# Patient Record
Sex: Female | Born: 1965 | Race: White | Hispanic: No | Marital: Married | Smoking: Never smoker
Health system: Southern US, Community
[De-identification: ages and names within clinical notes are randomized; demographics above are authoritative.]

## PROBLEM LIST (undated history)

## (undated) DIAGNOSIS — E119 Type 2 diabetes mellitus without complications: Secondary | ICD-10-CM

## (undated) DIAGNOSIS — E079 Disorder of thyroid, unspecified: Secondary | ICD-10-CM

## (undated) HISTORY — PX: ABDOMINAL HYSTERECTOMY: SHX81

## (undated) HISTORY — PX: CHOLECYSTECTOMY: SHX55

---

## 2011-02-27 ENCOUNTER — Other Ambulatory Visit: Payer: Self-pay | Admitting: Orthopedic Surgery

## 2011-02-27 ENCOUNTER — Ambulatory Visit
Admission: RE | Admit: 2011-02-27 | Discharge: 2011-02-27 | Disposition: A | Discharge status: Home / Self Care | Payer: BC Managed Care – PPO | Source: Ambulatory Visit | Attending: Orthopedic Surgery | Admitting: Orthopedic Surgery

## 2011-02-27 DIAGNOSIS — R52 Pain, unspecified: Secondary | ICD-10-CM

## 2011-02-27 DIAGNOSIS — R609 Edema, unspecified: Secondary | ICD-10-CM

## 2019-12-06 ENCOUNTER — Encounter: Payer: Self-pay | Admitting: Gastroenterology

## 2019-12-07 ENCOUNTER — Encounter (HOSPITAL_BASED_OUTPATIENT_CLINIC_OR_DEPARTMENT_OTHER): Payer: Self-pay

## 2019-12-07 ENCOUNTER — Emergency Department (HOSPITAL_BASED_OUTPATIENT_CLINIC_OR_DEPARTMENT_OTHER): Payer: BC Managed Care – PPO

## 2019-12-07 ENCOUNTER — Other Ambulatory Visit: Payer: Self-pay

## 2019-12-07 ENCOUNTER — Emergency Department (HOSPITAL_BASED_OUTPATIENT_CLINIC_OR_DEPARTMENT_OTHER)
Admission: EM | Admit: 2019-12-07 | Discharge: 2019-12-07 | Disposition: A | Discharge status: Home / Self Care | Payer: BC Managed Care – PPO | Attending: Emergency Medicine | Admitting: Emergency Medicine

## 2019-12-07 DIAGNOSIS — I10 Essential (primary) hypertension: Secondary | ICD-10-CM | POA: Diagnosis not present

## 2019-12-07 DIAGNOSIS — Z9049 Acquired absence of other specified parts of digestive tract: Secondary | ICD-10-CM | POA: Insufficient documentation

## 2019-12-07 DIAGNOSIS — Z79899 Other long term (current) drug therapy: Secondary | ICD-10-CM | POA: Diagnosis not present

## 2019-12-07 DIAGNOSIS — R778 Other specified abnormalities of plasma proteins: Secondary | ICD-10-CM

## 2019-12-07 DIAGNOSIS — R112 Nausea with vomiting, unspecified: Secondary | ICD-10-CM | POA: Diagnosis present

## 2019-12-07 DIAGNOSIS — R748 Abnormal levels of other serum enzymes: Secondary | ICD-10-CM | POA: Insufficient documentation

## 2019-12-07 DIAGNOSIS — K529 Noninfective gastroenteritis and colitis, unspecified: Secondary | ICD-10-CM | POA: Insufficient documentation

## 2019-12-07 LAB — URINALYSIS, ROUTINE W REFLEX MICROSCOPIC
Bilirubin Urine: NEGATIVE
Glucose, UA: NEGATIVE mg/dL
Hgb urine dipstick: NEGATIVE
Ketones, ur: NEGATIVE mg/dL
Leukocytes,Ua: NEGATIVE
Nitrite: NEGATIVE
Protein, ur: NEGATIVE mg/dL
Specific Gravity, Urine: 1.02 (ref 1.005–1.030)
pH: 7 (ref 5.0–8.0)

## 2019-12-07 LAB — COMPREHENSIVE METABOLIC PANEL
ALT: 24 U/L (ref 0–44)
AST: 18 U/L (ref 15–41)
Albumin: 4.1 g/dL (ref 3.5–5.0)
Alkaline Phosphatase: 54 U/L (ref 38–126)
Anion gap: 11 (ref 5–15)
BUN: 19 mg/dL (ref 6–20)
CO2: 25 mmol/L (ref 22–32)
Calcium: 9.5 mg/dL (ref 8.9–10.3)
Chloride: 100 mmol/L (ref 98–111)
Creatinine, Ser: 1.19 mg/dL — ABNORMAL HIGH (ref 0.44–1.00)
GFR calc Af Amer: 60 mL/min — ABNORMAL LOW (ref >60)
GFR calc non Af Amer: 51 mL/min — ABNORMAL LOW (ref >60)
Glucose, Bld: 102 mg/dL — ABNORMAL HIGH (ref 70–99)
Potassium: 4.3 mmol/L (ref 3.5–5.1)
Sodium: 136 mmol/L (ref 135–145)
Total Bilirubin: 0.3 mg/dL (ref 0.3–1.2)
Total Protein: 7.8 g/dL (ref 6.5–8.1)

## 2019-12-07 LAB — CBC
HCT: 44.4 % (ref 36.0–46.0)
Hemoglobin: 13.8 g/dL (ref 12.0–15.0)
MCH: 27.8 pg (ref 26.0–34.0)
MCHC: 31.1 g/dL (ref 30.0–36.0)
MCV: 89.3 fL (ref 80.0–100.0)
Platelets: 396 10*3/uL (ref 150–400)
RBC: 4.97 MIL/uL (ref 3.87–5.11)
RDW: 13.6 % (ref 11.5–15.5)
WBC: 11.8 10*3/uL — ABNORMAL HIGH (ref 4.0–10.5)
nRBC: 0 % (ref 0.0–0.2)

## 2019-12-07 LAB — TROPONIN I (HIGH SENSITIVITY)
Troponin I (High Sensitivity): 53 ng/L — ABNORMAL HIGH (ref <18)
Troponin I (High Sensitivity): 41 ng/L — ABNORMAL HIGH (ref <18)

## 2019-12-07 LAB — LIPASE, BLOOD: Lipase: 26 U/L (ref 11–51)

## 2019-12-07 MED ORDER — METRONIDAZOLE 500 MG PO TABS: 500.0000 mg | ORAL_TABLET | Freq: Two times a day (BID) | ORAL | 0 refills | Status: DC

## 2019-12-07 MED ORDER — CIPROFLOXACIN HCL 500 MG PO TABS: 500.0000 mg | ORAL_TABLET | Freq: Two times a day (BID) | ORAL | 0 refills | Status: AC

## 2019-12-07 MED ORDER — SODIUM CHLORIDE 0.9% FLUSH
3.0000 mL | Freq: Once | INTRAVENOUS | Status: DC
  Filled 2019-12-07: qty 3

## 2019-12-07 MED ORDER — ONDANSETRON 4 MG PO TBDP: 4.0000 mg | ORAL_TABLET | Freq: Three times a day (TID) | ORAL | 0 refills | Status: DC | PRN

## 2019-12-07 MED ORDER — IOHEXOL 300 MG/ML  SOLN
100.0000 mL | Freq: Once | INTRAMUSCULAR | Status: AC | PRN
  Administered 2019-12-07: 100 mL via INTRAVENOUS

## 2019-12-07 MED ORDER — ONDANSETRON HCL 4 MG/2ML IJ SOLN
4.0000 mg | Freq: Once | INTRAMUSCULAR | Status: AC
  Administered 2019-12-07: 4 mg via INTRAVENOUS
  Filled 2019-12-07: qty 2

## 2019-12-07 MED FILL — CIPROFLOXACIN HCL 500 MG TA: 500 | 7 days supply | Qty: 14 | Fill #0

## 2019-12-07 MED FILL — METRONIDAZOLE 500 MG TABS: 500 | 7 days supply | Qty: 14 | Fill #0

## 2019-12-07 MED FILL — ONDANSETRON ODT 4 MG TABLET: 4 | 2 days supply | Qty: 6 | Fill #0

## 2019-12-07 NOTE — ED Triage Notes (Signed)
Pt c/o n/v x 2 week-diarrhea with near syncopal episode today x today-NAD-steady gait

## 2019-12-07 NOTE — ED Notes (Signed)
Ginger ale and crackers given. Provider at bedside.

## 2019-12-07 NOTE — ED Provider Notes (Signed)
[facility] EMERGENCY DEPARTMENT Provider Note   CSN: [id number] Arrival date & time: 12/07/19  1138     History Chief Complaint  Patient presents with   Emesis    Alicia Scott is a 54 y.o. female past medical Struve hypertension, cholecystectomy who presents for evaluation of nausea/vomiting x2 weeks.  She reports that her last 2 to 3 weeks, after she has eaten something, she will have vomiting.  She states it occurs about 15 minutes after she eats.  She states no blood noted in emesis.  No bile noted.  States this occurs every time after she eats and has not been able to tolerate much p.o.  She states she will get a feeling like heartburn in her epigastric region and cramping in her stomach.  She has not had any fevers with this.  She reports that yesterday she felt slightly lightheaded.  Today, she ate breakfast and states that she was able to keep it down.  She reports that while she was getting ready for work, she started feeling gassy in her abdomen and started having sharp shooting pains in her abdomen.  Reports that she went to the bathroom and had an episode of diarrhea.  No blood noted in stool.  Patient reports that she got very clammy, diaphoretic and had to lay down against the floor.  She went to urgent care who did an EKG and chest x-ray but told her she needed to be evaluated for her heart.  Patient reports that the episode today was the first time that that happened.  She states she did not have any chest pain but was concerned that other symptoms were masking it.  She reports no chest pain right now.  She states she has had some slight lower abdominal pain, right greater than left.  She denies any difficulty breathing, dysuria, hematuria.  No recent travel.  No known Covid exposure.  She has a history of hypertension but no history of diabetes.  No personal cardiac history.  No family history of MI before the age of 32.  She does not smoke.  The history is provided  by the patient.    HPI: A 53 year old patient with a history of hypertension presents for evaluation of chest pain. Initial onset of pain was approximately 1-3 hours ago. The patient's chest pain is not worse with exertion. The patient complains of nausea and reports some diaphoresis. The patient's chest pain is not middle- or left-sided, is not well-localized, is not described as heaviness/pressure/tightness, is not sharp and does not radiate to the arms/jaw/neck. The patient has no history of stroke, has no history of peripheral artery disease, has not smoked in the past 90 days, denies any history of treated diabetes, has no relevant family history of coronary artery disease (first degree relative at less than age 30), has no history of hypercholesterolemia and does not have an elevated BMI (>=30).   History reviewed. No pertinent past medical history.  There are no problems to display for this patient.   Past Surgical History:  Procedure Laterality Date   ABDOMINAL HYSTERECTOMY     CHOLECYSTECTOMY       OB History   No obstetric history on file.     No family history on file.  Social History   Tobacco Use   Smoking status: Never Smoker   Smokeless tobacco: Never Used  Substance Use Topics   Alcohol use: Yes    Comment: occ   Drug use: Never  Home Medications Prior to Admission medications   Medication Sig Start Date End Date Taking? Authorizing Provider  buPROPion (WELLBUTRIN) 75 MG tablet Take 75 mg by mouth daily.   Yes [provider]  levothyroxine (SYNTHROID) 125 MCG tablet Take 125 mcg by mouth daily before breakfast.   Yes [provider]  lisinopril (ZESTRIL) 10 MG tablet Take 10 mg by mouth daily.   Yes [provider]  venlafaxine XR (EFFEXOR-XR) 150 MG 24 hr capsule Take 150 mg by mouth daily with breakfast.   Yes [provider]  zolpidem (AMBIEN CR) 12.5 MG CR tablet Take 12.5 mg by mouth at bedtime as needed for  sleep.   Yes [provider]  ciprofloxacin (CIPRO) 500 MG tablet Take 1 tablet (500 mg total) by mouth every 12 (twelve) hours for 7 days. 12/07/19 12/14/19  Maxwell CaulLayden, Kischa Altice A, PA-C  metroNIDAZOLE (FLAGYL) 500 MG tablet Take 1 tablet (500 mg total) by mouth 2 (two) times daily. 12/07/19   Maxwell CaulLayden, Taylan Mayhan A, PA-C  ondansetron (ZOFRAN ODT) 4 MG disintegrating tablet Take 1 tablet (4 mg total) by mouth every 8 (eight) hours as needed for nausea or vomiting. 12/07/19   Maxwell CaulLayden, Rhona Fusilier A, PA-C    Allergies    Penicillins and Sulfa antibiotics  Review of Systems   Review of Systems  Constitutional: Negative for fever.  Respiratory: Negative for cough and shortness of breath.   Cardiovascular: Negative for chest pain.  Gastrointestinal: Positive for abdominal pain, diarrhea, nausea and vomiting.  Genitourinary: Negative for dysuria and hematuria.  Neurological: Negative for headaches.  All other systems reviewed and are negative.   Physical Exam Updated Vital Signs BP 117/81    Pulse 94    Temp 98.2 F (36.8 C) (Oral)    Resp 14    Ht 5\' 9"  (1.753 m)    Wt 115.7 kg    SpO2 97%    BMI 37.66 kg/m   Physical Exam Vitals and nursing note reviewed.  Constitutional:      Appearance: Normal appearance. She is well-developed.  HENT:     Head: Normocephalic and atraumatic.  Eyes:     General: Lids are normal.     Conjunctiva/sclera: Conjunctivae normal.     Pupils: Pupils are equal, round, and reactive to light.  Cardiovascular:     Rate and Rhythm: Normal rate and regular rhythm.     Pulses: Normal pulses.     Heart sounds: Normal heart sounds. No murmur. No friction rub. No gallop.   Pulmonary:     Effort: Pulmonary effort is normal.     Breath sounds: Normal breath sounds.     Comments: Lungs clear to auscultation bilaterally.  Symmetric chest rise.  No wheezing, rales, rhonchi. Abdominal:     Palpations: Abdomen is soft. Abdomen is not rigid.     Tenderness: There is abdominal  tenderness in the right lower quadrant. There is no right CVA tenderness, left CVA tenderness or guarding.     Comments: Abdomen soft, nondistended.  Tenderness palpation in the right lower quadrant.  No rigidity, guarding.  No CVA tenderness noted bilaterally.  Musculoskeletal:        General: Normal range of motion.     Cervical back: Full passive range of motion without pain.  Skin:    General: Skin is warm and dry.     Capillary Refill: Capillary refill takes less than 2 seconds.  Neurological:     Mental Status: She is alert and oriented  to person, place, and time.  Psychiatric:        Speech: Speech normal.     ED Results / Procedures / Treatments   Labs (all labs ordered are listed, but only abnormal results are displayed) Labs Reviewed  COMPREHENSIVE METABOLIC PANEL - Abnormal; Notable for the following components:      Result Value   Glucose, Bld 102 (*)    Creatinine, Ser 1.19 (*)    GFR calc non Af Amer 51 (*)    GFR calc Af Amer 60 (*)    All other components within normal limits  CBC - Abnormal; Notable for the following components:   WBC 11.8 (*)    All other components within normal limits  TROPONIN I (HIGH SENSITIVITY) - Abnormal; Notable for the following components:   Troponin I (High Sensitivity) 53 (*)    All other components within normal limits  TROPONIN I (HIGH SENSITIVITY) - Abnormal; Notable for the following components:   Troponin I (High Sensitivity) 41 (*)    All other components within normal limits  LIPASE, BLOOD  URINALYSIS, ROUTINE W REFLEX MICROSCOPIC    EKG EKG Interpretation  Date/Time:  Wednesday December 07 2019 13:02:56 EDT Ventricular Rate:  85 PR Interval:    QRS Duration: 106 QT Interval:  395 QTC Calculation: 470 R Axis:   23 Text Interpretation: Sinus rhythm EKG WITHIN NORMAL LIMITS Confirmed by Theotis Burrow [phone]) on 12/07/2019 2:47:26 PM   Radiology CT ABDOMEN PELVIS W CONTRAST  Result Date: 12/07/2019 CLINICAL DATA:   54 year old female with nausea vomiting and diarrhea for 2 weeks. Lower abdominal pain, greater on the right. EXAM: CT ABDOMEN AND PELVIS WITH CONTRAST TECHNIQUE: Multidetector CT imaging of the abdomen and pelvis was performed using the standard protocol following bolus administration of intravenous contrast. CONTRAST:  177mL OMNIPAQUE IOHEXOL 300 MG/ML  SOLN COMPARISON:  None. FINDINGS: Lower chest: Negative. Small calcified granuloma in the left lower lobe. Hepatobiliary: Surgically absent gallbladder. Hepatic steatosis. No bile duct enlargement. Pancreas: Negative. Spleen: Negative. Adrenals/Urinary Tract: Normal adrenal glands. Symmetric renal enhancement and contrast excretion with decompressed and normal proximal ureters. No nephrolithiasis. Diminutive and unremarkable urinary bladder. Stomach/Bowel: Decompressed but thick-walled and redundant sigmoid colon throughout the pelvis. Similar decompressed but inflamed appearance of the rectum, descending colon. The transverse and right colon are intermittently more normal, but with some decompressed segments which also appear mildly inflamed. The cecum is on a lax mesentery in the mid abdomen containing stool, with a suspected diminutive and normal appendix on series 2, image 62. Decompressed and negative terminal ileum. Oral contrast has not yet reached the distal small bowel. No dilated or inflamed small bowel loops identified. Negative stomach and duodenum. No free air or free fluid. Vascular/Lymphatic: Mild atherosclerosis. The major arterial structures are patent. Portal venous system appears patent. No lymphadenopathy. Reproductive: Surgically absent uterus. Normal left ovary on series 2, image 68. The right ovary is diminutive or absent. Other: No pelvic free fluid. Musculoskeletal: Lower lumbar facet degeneration. Degenerative changes to both SI joints. No acute osseous abnormality identified. IMPRESSION: 1. Much of the large bowel is decompressed but  appears inflamed compatible with Widespread Infectious Or Inflammatory Colitis. The right colon is relatively spared, with no evidence of acute appendicitis. 2. No abnormal small bowel. No free fluid. 3. Hepatic steatosis. Electronically Signed   By: Genevie Ann M.D.   On: 12/07/2019 15:58    Procedures Procedures (including critical care time)  Medications Ordered in ED Medications  sodium chloride  flush (NS) 0.9 % injection 3 mL (3 mLs Intravenous Not Given 12/07/19 1255)  iohexol (OMNIPAQUE) 300 MG/ML solution 100 mL (100 mLs Intravenous Contrast Given 12/07/19 1517)  ondansetron (ZOFRAN) injection 4 mg (4 mg Intravenous Given 12/07/19 1601)    ED Course  I have reviewed the triage vital signs and the nursing notes.  Pertinent labs & imaging results that were available during my care of the patient were reviewed by me and considered in my medical decision making (see chart for details).    MDM Rules/Calculators/A&P HEAR Score: 43                    54 year old female who presents for evaluation of nausea/vomiting x2 weeks and an episode of near syncope earlier today.  She reports that she has not been able to tolerate much p.o. over the last 2 to 3 weeks.  Today she had an episode where she had a sharp pain in her abdomen and had an episode of diarrhea.  She reports that she got diaphoretic, had episode of vomiting and had to lay down on the floor.  She went to urgent care for evaluation of symptoms and they told her to come to the emergency department for further evaluation.  On ED arrival, she states she has no chest pain.  She does report some lower abdominal pain.  On initial ED arrival, she is afebrile, nontoxic-appearing.  Vital signs are stable.  Sitter infectious etiology such as viral GI versus foodborne illness.  Additionally, given that she has some right lower quadrant tenderness, question if this is appendicitis.  Plan for labs, imaging.  Additionally, low suspicion for ACS as her story  sounds atypical.  Patient states that she does not think she had chest pain but just "wondered if she was having a heart attack." She states that urgent care sent her over for evaluation of her heart.  Only cardiac risk factor is hypertension.  Plan to check labs, imaging.  Patient had a normal chest x-ray at urgent care.  UA negative for any infectious etiology.  Lipase is unremarkable.  CMP shows BUN of 19, creatinine of 1.19.  CBC shows slight leukocytosis at 11.8.  Hemoglobin is unremarkable.  Initial troponin is 53.  In her history/risk factors, she has a heart score of 3.  We will plan for delta troponin.  CT on pelvis shows large bowel is decompressed and inflamed compatible with widespread infectious or inflammatory colitis.  Right colon is relatively spared with no evidence of acute appendicitis.  Her delta troponin is 41.   I discussed Dr. Allyson Sabal (cardiology).  At this time, no indication for acute cardiac intervention.  Agrees with plan for discharge home.  Discussed results with patient.  Patient reports feeling better.  She is hemodynamically stable.  She has been able tolerate p.o. here in ED.  Repeat abdominal exam shows improved tenderness.  Given that her symptoms have been ongoing for about 2 weeks, will plan to treat as possible infectious etiology of colitis.  Patient with allergy to penicillin and sulfa.  She has tolerated Cipro in the past before.  Patient already has an appointment with GI set up for May 7.  I encouraged her to keep that appointment. At this time, patient exhibits no emergent life-threatening condition that require further evaluation in ED or admission. Patient had ample opportunity for questions and discussion. All patient's questions were answered with full understanding. Strict return precautions discussed. Patient expresses understanding and agreement  to plan.   Portions of this note were generated with Scientist, clinical (histocompatibility and immunogenetics). Dictation errors may occur  despite best attempts at proofreading.   Final Clinical Impression(s) / ED Diagnoses Final diagnoses:  Colitis  Elevated troponin    Rx / DC Orders ED Discharge Orders         Ordered    ciprofloxacin (CIPRO) 500 MG tablet  Every 12 hours     12/07/19 1652    metroNIDAZOLE (FLAGYL) 500 MG tablet  2 times daily     12/07/19 1652    ondansetron (ZOFRAN ODT) 4 MG disintegrating tablet  Every 8 hours PRN     12/07/19 1652           Maxwell Caul, PA-C 12/07/19 1723    Little, Ambrose Finland, MD 12/08/19 [phone]

## 2019-12-07 NOTE — Discharge Instructions (Signed)
As we discussed, your CT scan did show evidence of colitis.  Take antibiotics as directed. Please take all of your antibiotics until finished.  Follow-up with your primary care doctor.  Your EKG was reassuring.  As we discussed, your troponin was slightly elevated today though your work-up otherwise is reassuring.  Your troponin decreased while being here in the ED.  Follow-up with your primary care doctor.  Return emergency department for any worsening abdominal pain, inability to eat or drink anything, fever or any other worsening concerning symptoms.

## 2019-12-07 NOTE — ED Notes (Signed)
Pt on monitor 

## 2019-12-30 ENCOUNTER — Other Ambulatory Visit: Payer: Self-pay

## 2019-12-30 ENCOUNTER — Ambulatory Visit: Payer: BC Managed Care – PPO | Admitting: Gastroenterology

## 2019-12-30 ENCOUNTER — Encounter: Payer: Self-pay | Admitting: Gastroenterology

## 2019-12-30 VITALS — BP 120/88 | HR 85 | Temp 97.1°F | Ht 69.0 in | Wt 254.0 lb

## 2019-12-30 DIAGNOSIS — K219 Gastro-esophageal reflux disease without esophagitis: Secondary | ICD-10-CM | POA: Diagnosis not present

## 2019-12-30 MED ORDER — OMEPRAZOLE 20 MG PO CPDR: 20.0000 mg | DELAYED_RELEASE_CAPSULE | Freq: Every morning | ORAL | 3 refills | Status: DC

## 2019-12-30 NOTE — Progress Notes (Signed)
Chief Complaint:   Referring Provider:  No ref. provider found      ASSESSMENT AND PLAN;   #1.  Longstanding  H/O GERD  #2. Recent visit d/t acute GEitis 12/07/2019. Neg CT AP otherwise. Neg screening colon 06/2017.  Plan: - Omeprazole 20mg  po qd #30, 11 refiils. - EGD for further evaluation.  Discussed risks and benefits.  She wishes to proceed. - Records for last colon from [facility].  Since colonoscopy was negative previously and patient is very reliable, I would hold off on repeat colonoscopy at the present time.  Of note that she is not having any red flag symptoms.  She agrees with the above approach. - Nonpharmacologic means of reflux control.   HPI:    Alicia Scott is a 54 y.o. female   [facility] ED visit 12/07/2019 with N/V/diarrhea, CT AP showed colitis, likely acute GEitis. Treated with Cipro/Flagyl/Zofran.  Her cardiac eval was neg. Comes to GI clinic for follow-up visit.  Has longstanding history of "severe acid reflux" x several yrs, more over last 2 months.  She also had it when she was pregnant over 30 years ago.  No odynophagia or dysphagia.  Took omeprazole for 14 days with good relief.  Has history of constipation.  Never had diarrhea before the above episode.  She could not identify any definite triggering factors.  No bowel problems currently.  Having BM1/day.  No abdominal pain.  No fever chills night sweats.  No recent weight loss.  No nonsteroidals.  Nobody in the family had any viral illness.  She was not tested for COVID-19.  Her CBC revealed mildly elevated WBC count.  She had normal CMP and lipase except for mild dehydration.  She had colonoscopy at age 43 which was unremarkable.  Told that she does not need repeat colonoscopy for 10 years.  From care everywhere I could see a procedure done on (likely colon) 07/21/2017 at [facility]- Report awaited-not in care everywhere.   History reviewed. No pertinent past medical history.  Past Surgical History:    Procedure Laterality Date  . ABDOMINAL HYSTERECTOMY    . CHOLECYSTECTOMY      Family History  Problem Relation Age of Onset  . Kidney Stones Mother   . Ulcerative colitis Daughter     Social History   Tobacco Use  . Smoking status: Never Smoker  . Smokeless tobacco: Never Used  Substance Use Topics  . Alcohol use: Yes    Comment: occ  . Drug use: Never    Current Outpatient Medications  Medication Sig Dispense Refill  . buPROPion (WELLBUTRIN) 75 MG tablet Take 75 mg by mouth daily.    [facility] levothyroxine (SYNTHROID) 125 MCG tablet Take 125 mcg by mouth daily before breakfast.    . lisinopril (ZESTRIL) 10 MG tablet Take 10 mg by mouth daily.    Marland Kitchen venlafaxine XR (EFFEXOR-XR) 150 MG 24 hr capsule Take 150 mg by mouth daily with breakfast.    . zolpidem (AMBIEN CR) 12.5 MG CR tablet Take 12.5 mg by mouth at bedtime as needed for sleep.     No current facility-administered medications for this visit.    Allergies  Allergen Reactions  . Penicillins   . Sulfa Antibiotics     Review of Systems:  Constitutional: Denies fever, chills, diaphoresis, appetite change and fatigue.  HEENT: Denies photophobia, eye pain, redness, hearing loss, ear pain, congestion, sore throat, rhinorrhea, sneezing, mouth sores, neck pain, neck stiffness and tinnitus.   Respiratory: Denies SOB,  DOE, cough, chest tightness,  and wheezing.   Cardiovascular: Denies chest pain, palpitations and leg swelling.  Genitourinary: Denies dysuria, urgency, frequency, hematuria, flank pain and difficulty urinating.  Musculoskeletal: Denies myalgias, back pain, joint swelling, arthralgias and gait problem.  Skin: No rash.  Neurological: Denies dizziness, seizures, syncope, weakness, light-headedness, numbness and headaches.  Hematological: Denies adenopathy. Easy bruising, personal or family bleeding history  Psychiatric/Behavioral: HAS anxiety or depression and insomnia     Physical Exam:    BP 120/88   Pulse  85   Temp (!) 97.1 F (36.2 C)   Ht 5\' 9"  (1.753 m)   Wt 254 lb (115.2 kg)   BMI 37.51 kg/m  Wt Readings from Last 3 Encounters:  12/30/19 254 lb (115.2 kg)  12/07/19 255 lb (115.7 kg)   Constitutional:  Well-developed, in no acute distress. Psychiatric: Normal mood and affect. Behavior is normal. HEENT: Pupils normal.  Conjunctivae are normal. No scleral icterus. Neck supple.  Cardiovascular: Normal rate, regular rhythm. No edema Pulmonary/chest: Effort normal and breath sounds normal. No wheezing, rales or rhonchi. Abdominal: Soft, nondistended.  Mild epigastric tenderness.  Bowel sounds active throughout. There are no masses palpable. No hepatomegaly. Rectal:  defered Neurological: Alert and oriented to person place and time. Skin: Skin is warm and dry. No rashes noted.  Data Reviewed: I have personally reviewed following labs and imaging studies  CBC: CBC Latest Ref Rng & Units 12/07/2019  WBC 4.0 - 10.5 K/uL 11.8(H)  Hemoglobin 12.0 - 15.0 g/dL 12/09/2019  Hematocrit 17.0 - 46.0 % 44.4  Platelets 150 - 400 K/uL 396    CMP: CMP Latest Ref Rng & Units 12/07/2019  Glucose 70 - 99 mg/dL 12/09/2019)  BUN 6 - 20 mg/dL 19  Creatinine 494(W - 9.67 mg/dL 5.91)  Sodium 6.38(G - 665 mmol/L 136  Potassium 3.5 - 5.1 mmol/L 4.3  Chloride 98 - 111 mmol/L 100  CO2 22 - 32 mmol/L 25  Calcium 8.9 - 10.3 mg/dL 9.5  Total Protein 6.5 - 8.1 g/dL 7.8  Total Bilirubin 0.3 - 1.2 mg/dL 0.3  Alkaline Phos 38 - 126 U/L 54  AST 15 - 41 U/L 18  ALT 0 - 44 U/L 24      Radiology Studies: CT ABDOMEN PELVIS W CONTRAST  Result Date: 12/07/2019 CLINICAL DATA:  54 year old female with nausea vomiting and diarrhea for 2 weeks. Lower abdominal pain, greater on the right. EXAM: CT ABDOMEN AND PELVIS WITH CONTRAST TECHNIQUE: Multidetector CT imaging of the abdomen and pelvis was performed using the standard protocol following bolus administration of intravenous contrast. CONTRAST:  53 OMNIPAQUE IOHEXOL 300  MG/ML  SOLN COMPARISON:  None. FINDINGS: Lower chest: Negative. Small calcified granuloma in the left lower lobe. Hepatobiliary: Surgically absent gallbladder. Hepatic steatosis. No bile duct enlargement. Pancreas: Negative. Spleen: Negative. Adrenals/Urinary Tract: Normal adrenal glands. Symmetric renal enhancement and contrast excretion with decompressed and normal proximal ureters. No nephrolithiasis. Diminutive and unremarkable urinary bladder. Stomach/Bowel: Decompressed but thick-walled and redundant sigmoid colon throughout the pelvis. Similar decompressed but inflamed appearance of the rectum, descending colon. The transverse and right colon are intermittently more normal, but with some decompressed segments which also appear mildly inflamed. The cecum is on a lax mesentery in the mid abdomen containing stool, with a suspected diminutive and normal appendix on series 2, image 62. Decompressed and negative terminal ileum. Oral contrast has not yet reached the distal small bowel. No dilated or inflamed small bowel loops identified. Negative stomach and duodenum. No  free air or free fluid. Vascular/Lymphatic: Mild atherosclerosis. The major arterial structures are patent. Portal venous system appears patent. No lymphadenopathy. Reproductive: Surgically absent uterus. Normal left ovary on series 2, image 68. The right ovary is diminutive or absent. Other: No pelvic free fluid. Musculoskeletal: Lower lumbar facet degeneration. Degenerative changes to both SI joints. No acute osseous abnormality identified. IMPRESSION: 1. Much of the large bowel is decompressed but appears inflamed compatible with Widespread Infectious Or Inflammatory Colitis. The right colon is relatively spared, with no evidence of acute appendicitis. 2. No abnormal small bowel. No free fluid. 3. Hepatic steatosis. Electronically Signed   By: Genevie Ann M.D.   On: 12/07/2019 15:58      Carmell Austria, MD 12/30/2019, 10:43 AM  Cc: No ref. provider  found

## 2019-12-30 NOTE — Patient Instructions (Addendum)
If you are age 56 or older, your body mass index should be between 23-30. Your Body mass index is 37.51 kg/m. If this is out of the aforementioned range listed, please consider follow up with your Primary Care Provider.  If you are age 30 or younger, your body mass index should be between 19-25. Your Body mass index is 37.51 kg/m. If this is out of the aformentioned range listed, please consider follow up with your Primary Care Provider.   You have been scheduled for an endoscopy. Please follow written instructions given to you at your visit today. If you use inhalers (even only as needed), please bring them with you on the day of your procedure. Your physician has requested that you go to www.startemmi.com and enter the access code given to you at your visit today. This web site gives a general overview about your procedure. However, you should still follow specific instructions given to you by our office regarding your preparation for the procedure.  We have sent the following medications to your pharmacy for you to pick up at your convenience: Omeprazole - take one 1/2 hour before breakfast.   We are requesting colonoscopy reports from [facility].   Thank you for choosing me and [facility].   Lynann Bologna, MD

## 2020-01-05 ENCOUNTER — Ambulatory Visit (AMBULATORY_SURGERY_CENTER): Payer: BC Managed Care – PPO | Admitting: Gastroenterology

## 2020-01-05 ENCOUNTER — Other Ambulatory Visit: Payer: Self-pay

## 2020-01-05 ENCOUNTER — Encounter: Payer: Self-pay | Admitting: Gastroenterology

## 2020-01-05 VITALS — BP 97/53 | HR 78 | Temp 97.3°F | Resp 11 | Ht 69.0 in | Wt 254.0 lb

## 2020-01-05 DIAGNOSIS — K219 Gastro-esophageal reflux disease without esophagitis: Secondary | ICD-10-CM | POA: Diagnosis not present

## 2020-01-05 DIAGNOSIS — K2950 Unspecified chronic gastritis without bleeding: Secondary | ICD-10-CM

## 2020-01-05 DIAGNOSIS — K2980 Duodenitis without bleeding: Secondary | ICD-10-CM

## 2020-01-05 MED ORDER — SODIUM CHLORIDE 0.9 % IV SOLN: 500.0000 mL | Freq: Once | INTRAVENOUS | Status: AC

## 2020-01-05 NOTE — Op Note (Addendum)
Endoscopy Center Patient Name: Alicia Scott Procedure Date: 01/05/2020 9:33 AM MRN: [id number] Endoscopist: Lynann Bologna , MD Age: 54 Referring MD:  Date of Birth: [birth year] Gender: Female Account #: [id number] Procedure:                Upper GI endoscopy Indications:              Epigastric abdominal pain, GERD Medicines:                Monitored Anesthesia Care Procedure:                Pre-Anesthesia Assessment:                           - Prior to the procedure, a History and Physical                            was performed, and patient medications and                            allergies were reviewed. The patient's tolerance of                            previous anesthesia was also reviewed. The risks                            and benefits of the procedure and the sedation                            options and risks were discussed with the patient.                            All questions were answered, and informed consent                            was obtained. Prior Anticoagulants: The patient has                            taken no previous anticoagulant or antiplatelet                            agents. ASA Grade Assessment: II - A patient with                            mild systemic disease. After reviewing the risks                            and benefits, the patient was deemed in                            satisfactory condition to undergo the procedure.                           After obtaining informed consent, the endoscope was  passed under direct vision. Throughout the                            procedure, the patient's blood pressure, pulse, and                            oxygen saturations were monitored continuously. The                            Endoscope was introduced through the mouth, and                            advanced to the second part of duodenum. The upper                            GI endoscopy was  accomplished without difficulty.                            The patient tolerated the procedure well. Scope In: Scope Out: Findings:                 LA Grade B (one or more mucosal breaks greater than                            5 mm, not extending between the tops of two mucosal                            folds) esophagitis with no bleeding was found 34 to                            36 cm from the incisors. Biopsies were taken with a                            cold forceps for histology.                           Localized mild inflammation characterized by                            erythema was found in the gastric antrum. Biopsies                            were taken with a cold forceps for histology.                           2 small 4 mm localized mucosal nodules were noted                            found in the duodenal bulb. Biopsies were taken                            with a cold forceps for histology. The second  portion of the duodenum was normal. Complications:            No immediate complications. Estimated Blood Loss:     Estimated blood loss: none. Impression:               - LA Grade B reflux esophagitis with no bleeding.                            Biopsied.                           - Gastritis. Biopsied.                           - Small benign appearing duodenal nodules (could be                            hypertrophied Brunner's glands). Biopsied. Recommendation:           - Patient has a contact number available for                            emergencies. The signs and symptoms of potential                            delayed complications were discussed with the                            patient. Return to normal activities tomorrow.                            Written discharge instructions were provided to the                            patient.                           - Resume previous diet.                           - Continue  omeprazole 20 mg p.o. once a day                           - Await pathology results.                           - Follow an antireflux regimen.                           - The findings and recommendations were discussed                            with the patient's family. Jackquline Denmark, MD 01/05/2020 9:58:37 AM This report has been signed electronically.

## 2020-01-05 NOTE — Patient Instructions (Signed)
YOU HAD AN ENDOSCOPIC PROCEDURE TODAY AT THE [facility]:   Refer to the procedure report that was given to you for any specific questions about what was found during the examination.  If the procedure report does not answer your questions, please call your gastroenterologist to clarify.  If you requested that your care partner not be given the details of your procedure findings, then the procedure report has been included in a sealed envelope for you to review at your convenience later.  YOU SHOULD EXPECT: Some feelings of bloating in the abdomen. Passage of more gas than usual.  Walking can help get rid of the air that was put into your GI tract during the procedure and reduce the bloating. If you had a lower endoscopy (such as a colonoscopy or flexible sigmoidoscopy) you may notice spotting of blood in your stool or on the toilet paper. If you underwent a bowel prep for your procedure, you may not have a normal bowel movement for a few days.  Please Note:  You might notice some irritation and congestion in your nose or some drainage.  This is from the oxygen used during your procedure.  There is no need for concern and it should clear up in a day or so.  SYMPTOMS TO REPORT IMMEDIATELY:    Following upper endoscopy (EGD)  Vomiting of blood or coffee ground material  New chest pain or pain under the shoulder blades  Painful or persistently difficult swallowing  New shortness of breath  Fever of 100F or higher  Black, tarry-looking stools  For urgent or emergent issues, a gastroenterologist can be reached at any hour by calling [phone]. Do not use MyChart messaging for urgent concerns.    DIET:  We do recommend a small meal at first, but then you may proceed to your regular diet.  Drink plenty of fluids but you should avoid alcoholic beverages for 24 hours.  MEDICATIONS: Continue Omeprazole 20 by mouth once daily.  FOLLOW ANTIREFLUX REGIMEN: See handout given to you by  your recovery nurse.  Please see handouts given to you by your recovery nurse.  ACTIVITY:  You should plan to take it easy for the rest of today and you should NOT DRIVE or use heavy machinery until tomorrow (because of the sedation medicines used during the test).    FOLLOW UP: Our staff will call the number listed on your records 48-72 hours following your procedure to check on you and address any questions or concerns that you may have regarding the information given to you following your procedure. If we do not reach you, we will leave a message.  We will attempt to reach you two times.  During this call, we will ask if you have developed any symptoms of COVID 19. If you develop any symptoms (ie: fever, flu-like symptoms, shortness of breath, cough etc.) before then, please call [phone].  If you test positive for Covid 19 in the 2 weeks post procedure, please call and report this information to [place].    If any biopsies were taken you will be contacted by phone or by letter within the next 1-3 weeks.  Please call us at [phone] if you have not heard about the biopsies in 3 weeks.   Thank you for allowing [place] to provide for your healthcare needs today.   SIGNATURES/CONFIDENTIALITY: You and/or your care partner have signed paperwork which will be entered into your electronic medical record.  These signatures attest to the  fact that that the information above on your After Visit Summary has been reviewed and is understood.  Full responsibility of the confidentiality of this discharge information lies with you and/or your care-partner.

## 2020-01-05 NOTE — Progress Notes (Signed)
pt tolerated well. VSS. awake and to recovery. Report given to RN.  

## 2020-01-05 NOTE — Progress Notes (Signed)
Pt's states no medical or surgical changes since previsit or office visit. 

## 2020-01-05 NOTE — Progress Notes (Signed)
Called to room to assist during endoscopic procedure.  Patient ID and intended procedure confirmed with present staff. Received instructions for my participation in the procedure from the performing physician.  

## 2020-01-09 ENCOUNTER — Telehealth: Payer: Self-pay

## 2020-01-09 ENCOUNTER — Telehealth: Payer: Self-pay | Admitting: *Deleted

## 2020-01-09 NOTE — Telephone Encounter (Signed)
  Follow up Call-  Call back number 01/05/2020  Post procedure Call Back phone  # [phone]  Permission to leave phone message Yes  Some recent data might be hidden     Patient questions:  Do you have a fever, pain , or abdominal swelling? No. Pain Score  0 *  Have you tolerated food without any problems? Yes.    Have you been able to return to your normal activities? Yes.    Do you have any questions about your discharge instructions: Diet   No. Medications  No. Follow up visit  No.  Do you have questions or concerns about your Care? No.  Actions: * If pain score is 4 or above: No action needed, pain <4.  1. Have you developed a fever since your procedure? no  2.   Have you had an respiratory symptoms (SOB or cough) since your procedure? no  3.   Have you tested positive for COVID 19 since your procedure no  4.   Have you had any family members/close contacts diagnosed with the COVID 19 since your procedure?  no   If yes to any of these questions please route to Laverna Peace, RN and Charlett Lango, RN

## 2020-01-09 NOTE — Telephone Encounter (Signed)
Attempted f/u phone call. No answer. Left message. °

## 2020-01-15 ENCOUNTER — Encounter: Payer: Self-pay | Admitting: Gastroenterology

## 2020-01-18 ENCOUNTER — Other Ambulatory Visit: Payer: Self-pay | Admitting: Legal Medicine

## 2020-03-15 ENCOUNTER — Other Ambulatory Visit: Payer: Self-pay | Admitting: Gastroenterology

## 2020-03-15 ENCOUNTER — Telehealth: Payer: Self-pay | Admitting: Gastroenterology

## 2020-03-15 MED ORDER — OMEPRAZOLE 20 MG PO CPDR: 20.0000 mg | DELAYED_RELEASE_CAPSULE | Freq: Two times a day (BID) | ORAL | 11 refills | Status: DC

## 2020-03-15 NOTE — Telephone Encounter (Signed)
-----   Message from Lynann Bologna, MD sent at 03/15/2020 12:23 PM EDT ----- Please go ahead and increase it to 20 mg p.o. twice daily, 60, 11 refills  RG ----- Message ----- From: Dossie Arbour, LPN Sent: 8/67/6195  11:52 AM EDT To: Lynann Bologna, MD  Patient had EGD 01/06/20 with a normal path report,just mild reflux. She takes Omeprazole 20 mg daily and would like to take increase the dose to 20 mg twice daily due to increase reflux. Please advise.

## 2020-03-15 NOTE — Telephone Encounter (Signed)
Spoke to patient to let her know that she can increase her omeprazole to 20 mg twice daily new prescription sent to pharmacy.

## 2020-03-28 ENCOUNTER — Other Ambulatory Visit: Payer: Self-pay | Admitting: Gastroenterology

## 2020-04-02 ENCOUNTER — Other Ambulatory Visit: Payer: Self-pay

## 2020-04-02 ENCOUNTER — Encounter: Payer: Self-pay | Admitting: Gastroenterology

## 2020-04-02 ENCOUNTER — Ambulatory Visit (INDEPENDENT_AMBULATORY_CARE_PROVIDER_SITE_OTHER): Payer: BC Managed Care – PPO | Admitting: Gastroenterology

## 2020-04-02 VITALS — BP 120/74 | HR 89 | Ht 69.0 in | Wt 252.5 lb

## 2020-04-02 DIAGNOSIS — K5909 Other constipation: Secondary | ICD-10-CM | POA: Diagnosis not present

## 2020-04-02 DIAGNOSIS — K2210 Ulcer of esophagus without bleeding: Secondary | ICD-10-CM | POA: Diagnosis not present

## 2020-04-02 MED ORDER — PANTOPRAZOLE SODIUM 40 MG PO TBEC: 40.0000 mg | DELAYED_RELEASE_TABLET | Freq: Every day | ORAL | 6 refills | Status: AC

## 2020-04-02 MED ORDER — FAMOTIDINE 40 MG PO TABS
40.0000 mg | ORAL_TABLET | Freq: Every day | ORAL | 6 refills | Status: AC
Start: 2020-04-02 — End: ?

## 2020-04-02 NOTE — Patient Instructions (Signed)
We have sent the following medications to your pharmacy for you to pick up at your convenience: Protonix Pepcid  Discontinue Omeprazole  Please purchase the following medications over the counter and take as directed: Miralax 17 grams daily  Call in 2 weeks to report how you are feeling  RTO in 6 months  Thank you,  Dr. Lynann Bologna

## 2020-04-02 NOTE — Progress Notes (Signed)
Chief Complaint: FU  Referring Provider:  No ref. provider found      ASSESSMENT AND PLAN;   #1. Longstanding  H/O GERD  #2. Chronic constipation. Neg CT AP otherwise. Neg screening colon 05/2015  Plan: - Changed omeprazole to protonix 40mg  po QD, #30, 11 refills - Added Pepcid 40mg  po QHS 30, 6 refills - Nonpharmacologic means of reflux control. - Miralax 17g po qd - Call in 2 weeks.  If still with problems, proceed with solid-phase gastric emptying scan followed by 24-hour pH/manometry if needed. - FU in 6 months.   HPI:    Alicia Scott is a 54 y.o. female  For follow-up visit. Longstanding history of " severe acid reflux" EGD showed 01/06/2020 erosive esophagitis LA grade B despite being on omeprazole Currently having breakthrough symptoms specially in the afternoon with more indigestion and regurgitation.  No odynophagia or dysphagia. This is despite of increasing omeprazole to 20 twice daily.  Has history of constipation.  Has been more constipated lately.    No abdominal pain.  No fever chills night sweats.  No recent weight loss.  Past GI procedures: Colonoscopy 05/2015 : neg. repeat in 10 years.   EGD 01/06/2020: LA grade B erosive esophagitis, mild gastritis, duodenal nodules with negative biopsy.   CT abdomen/pelvis 12/07/2019: Colitis, likely due to acute gastroenteritis.  Treated with Cipro/Flagyl with good results.  Wt Readings from Last 3 Encounters:  04/02/20 252 lb 8 oz (114.5 kg)  01/05/20 254 lb (115.2 kg)  12/30/19 254 lb (115.2 kg)    History reviewed. No pertinent past medical history.  Past Surgical History:  Procedure Laterality Date  . ABDOMINAL HYSTERECTOMY    . CHOLECYSTECTOMY      Family History  Problem Relation Age of Onset  . Kidney Stones Mother   . Ulcerative colitis Daughter     Social History   Tobacco Use  . Smoking status: Never Smoker  . Smokeless tobacco: Never Used  Vaping Use  . Vaping Use: Never used    Substance Use Topics  . Alcohol use: Yes    Comment: occ  . Drug use: Never    Current Outpatient Medications  Medication Sig Dispense Refill  . buPROPion (WELLBUTRIN) 75 MG tablet Take 75 mg by mouth daily.    01/07/20 levothyroxine (SYNTHROID) 125 MCG tablet Take 125 mcg by mouth daily before breakfast.    . lisinopril (ZESTRIL) 10 MG tablet Take 10 mg by mouth daily.    02/29/20 omeprazole (PRILOSEC) 20 MG capsule Take 1 capsule (20 mg total) by mouth 2 (two) times daily before a meal. 60 capsule 11  . venlafaxine XR (EFFEXOR-XR) 150 MG 24 hr capsule Take 150 mg by mouth daily with breakfast.    . zolpidem (AMBIEN CR) 12.5 MG CR tablet Take 12.5 mg by mouth at bedtime as needed for sleep.     Current Facility-Administered Medications  Medication Dose Route Frequency Provider Last Rate Last Admin  . 0.9 %  sodium chloride infusion  500 mL Intravenous Once Marland Kitchen, MD        Allergies  Allergen Reactions  . Penicillins   . Sulfa Antibiotics     Review of Systems:  Constitutional: Denies fever, chills, diaphoresis, appetite change and fatigue.  HEENT: Denies photophobia, eye pain, redness, hearing loss, ear pain, congestion, sore throat, rhinorrhea, sneezing, mouth sores, neck pain, neck stiffness and tinnitus.   Respiratory: Denies SOB, DOE, cough, chest tightness,  and wheezing.   Cardiovascular: Denies  chest pain, palpitations and leg swelling.  Genitourinary: Denies dysuria, urgency, frequency, hematuria, flank pain and difficulty urinating.  Musculoskeletal: Denies myalgias, back pain, joint swelling, arthralgias and gait problem.  Skin: No rash.  Neurological: Denies dizziness, seizures, syncope, weakness, light-headedness, numbness and headaches.  Hematological: Denies adenopathy. Easy bruising, personal or family bleeding history  Psychiatric/Behavioral: HAS anxiety or depression and insomnia     Physical Exam:    BP 120/74   Pulse 89   Ht 5\' 9"  (1.753 m)   Wt 252 lb 8  oz (114.5 kg)   BMI 37.29 kg/m  Wt Readings from Last 3 Encounters:  04/02/20 252 lb 8 oz (114.5 kg)  01/05/20 254 lb (115.2 kg)  12/30/19 254 lb (115.2 kg)   Constitutional:  Well-developed, in no acute distress. Psychiatric: Normal mood and affect. Behavior is normal. HEENT: Pupils normal.  Conjunctivae are normal. No scleral icterus. Neck supple.  Cardiovascular: Normal rate, regular rhythm. No edema Pulmonary/chest: Effort normal and breath sounds normal. No wheezing, rales or rhonchi. Abdominal: Soft, nondistended.  Mild epigastric tenderness.  Bowel sounds active throughout. There are no masses palpable. No hepatomegaly. Rectal:  defered Neurological: Alert and oriented to person place and time. Skin: Skin is warm and dry. No rashes noted.  Data Reviewed: I have personally reviewed following labs and imaging studies  CBC: CBC Latest Ref Rng & Units 12/07/2019  WBC 4.0 - 10.5 K/uL 11.8(H)  Hemoglobin 12.0 - 15.0 g/dL 12/09/2019  Hematocrit 36 - 46 % 44.4  Platelets 150 - 400 K/uL 396    CMP: CMP Latest Ref Rng & Units 12/07/2019  Glucose 70 - 99 mg/dL 12/09/2019)  BUN 6 - 20 mg/dL 19  Creatinine 654(Y - 5.03 mg/dL 5.46)  Sodium 5.68(L - 275 mmol/L 136  Potassium 3.5 - 5.1 mmol/L 4.3  Chloride 98 - 111 mmol/L 100  CO2 22 - 32 mmol/L 25  Calcium 8.9 - 10.3 mg/dL 9.5  Total Protein 6.5 - 8.1 g/dL 7.8  Total Bilirubin 0.3 - 1.2 mg/dL 0.3  Alkaline Phos 38 - 126 U/L 54  AST 15 - 41 U/L 18  ALT 0 - 44 U/L 24      Radiology Studies: No results found.    170, MD 04/02/2020, 1:41 PM  Cc: No ref. provider found

## 2020-04-16 ENCOUNTER — Other Ambulatory Visit: Payer: Self-pay | Admitting: Gastroenterology

## 2020-04-16 DIAGNOSIS — R1110 Vomiting, unspecified: Secondary | ICD-10-CM

## 2020-04-16 DIAGNOSIS — K2210 Ulcer of esophagus without bleeding: Secondary | ICD-10-CM

## 2020-05-03 ENCOUNTER — Other Ambulatory Visit: Payer: Self-pay

## 2020-05-03 ENCOUNTER — Encounter (HOSPITAL_COMMUNITY)
Admission: RE | Admit: 2020-05-03 | Discharge: 2020-05-03 | Disposition: A | Discharge status: Home / Self Care | Payer: BC Managed Care – PPO | Source: Ambulatory Visit | Attending: Gastroenterology | Admitting: Gastroenterology

## 2020-05-03 DIAGNOSIS — R1110 Vomiting, unspecified: Secondary | ICD-10-CM | POA: Diagnosis not present

## 2020-05-03 MED ORDER — TECHNETIUM TC 99M SULFUR COLLOID
1.9000 | Freq: Once | INTRAVENOUS | Status: AC | PRN
  Administered 2020-05-03: 1.9 via INTRAVENOUS

## 2021-09-13 IMAGING — CT CT ABD-PELV W/ CM
2 of 5 series · 16 of 46 positions shown, 18 images · IV contrast (omnipaque)
Comparison: None.

CLINICAL DATA: 53-year-old female with nausea vomiting and diarrhea
for 2 weeks. Lower abdominal pain, greater on the right.

EXAM:
CT ABDOMEN AND PELVIS WITH CONTRAST
TECHNIQUE: Multidetector CT imaging of the abdomen and pelvis was performed
using the standard protocol following bolus administration of
intravenous contrast.
CONTRAST:  100mL OMNIPAQUE IOHEXOL 300 MG/ML  SOLN

[Series 2: axial st · axial · 0.98mm/px · z∈[+599,+1039]mm · 13 of 100 slices shown, 15 images]
[im 6/100  soft-tissue]
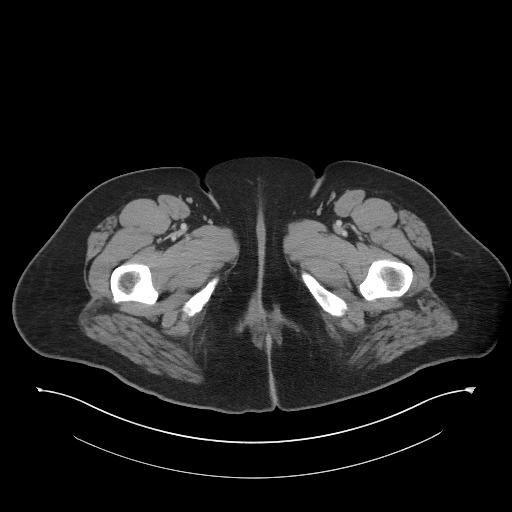
[im 6/100  bone]
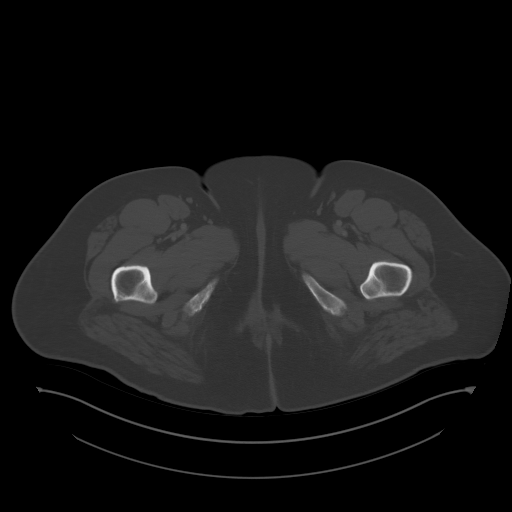
[im 12/100  soft-tissue]
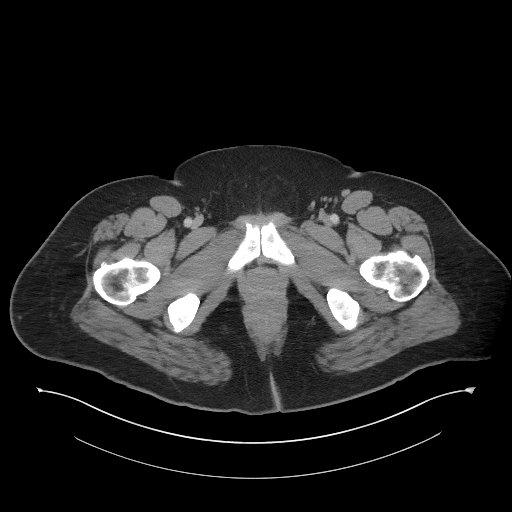
[im 23/100  soft-tissue]
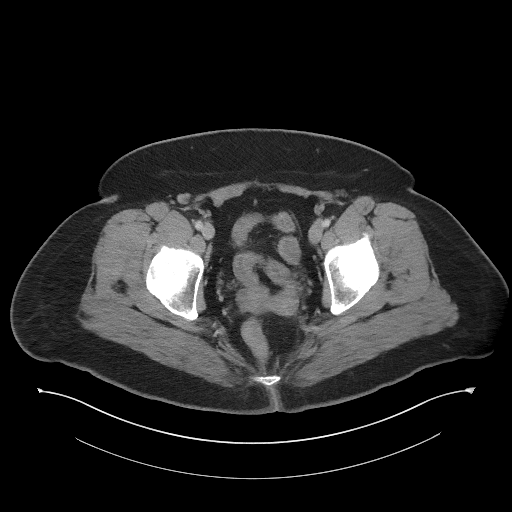
[im 28/100  soft-tissue]
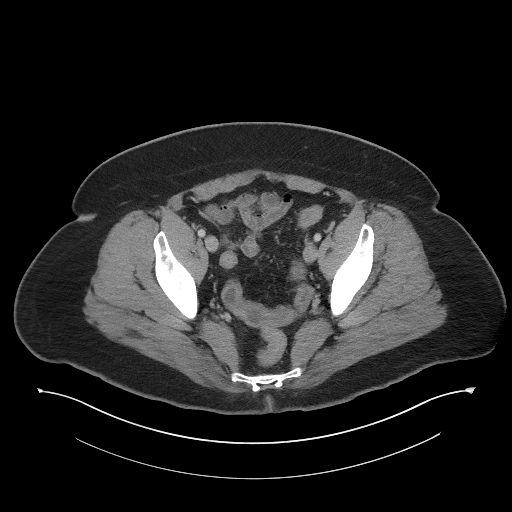
[im 34/100  soft-tissue]
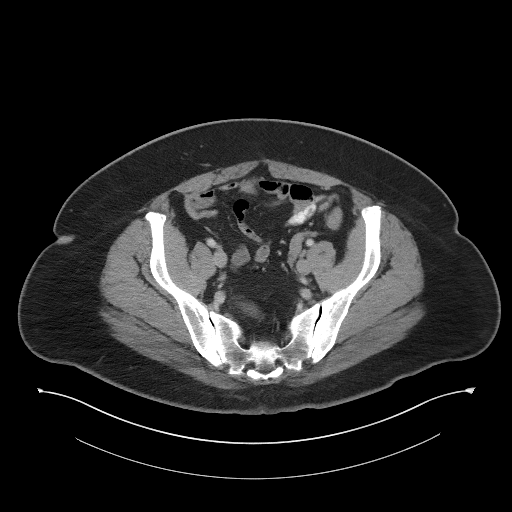
[im 45/100  soft-tissue]
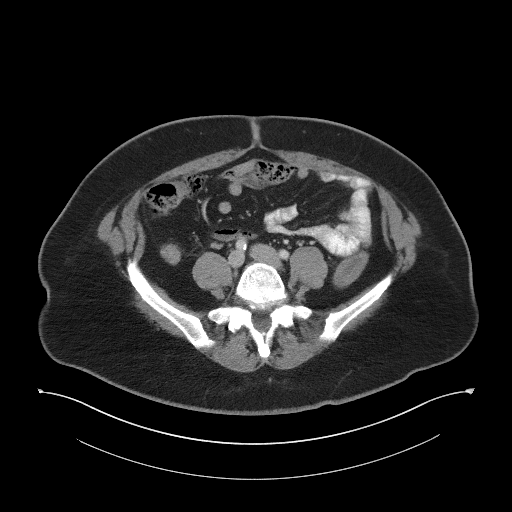
[im 50/100  soft-tissue]
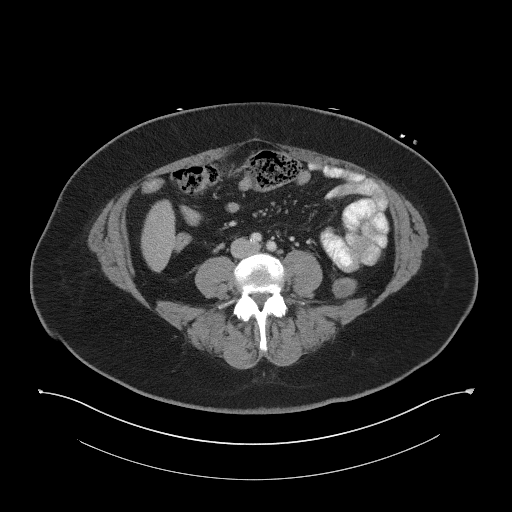
[im 56/100  soft-tissue]
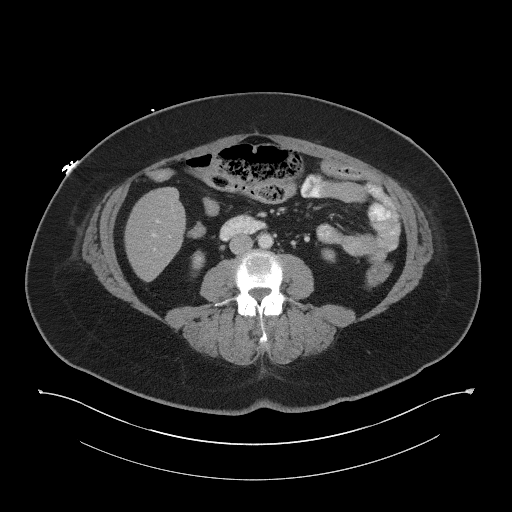
[im 67/100  soft-tissue]
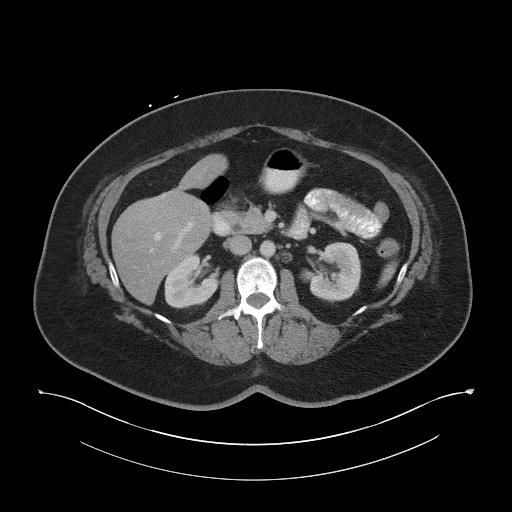
[im 67/100  bone]
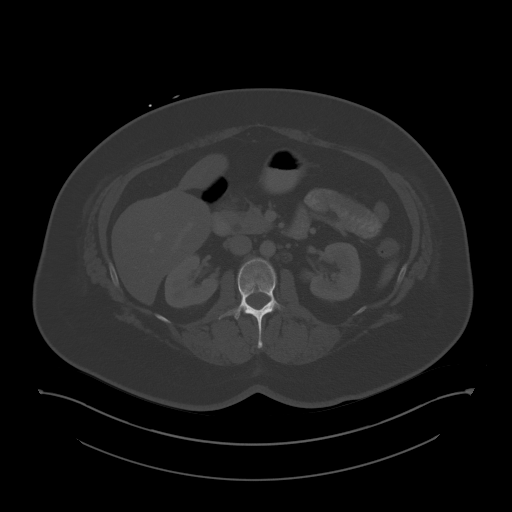
[im 72/100  soft-tissue]
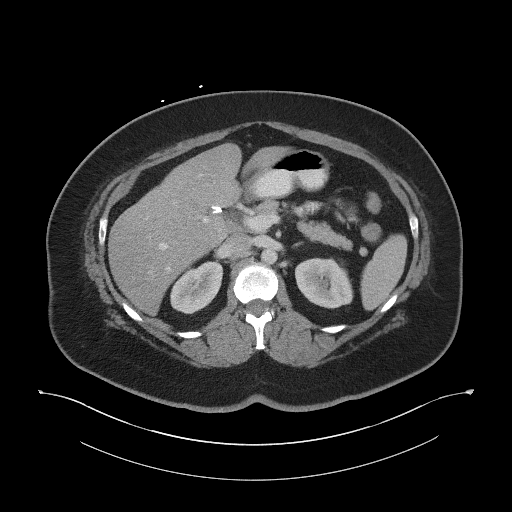
[im 78/100  soft-tissue]
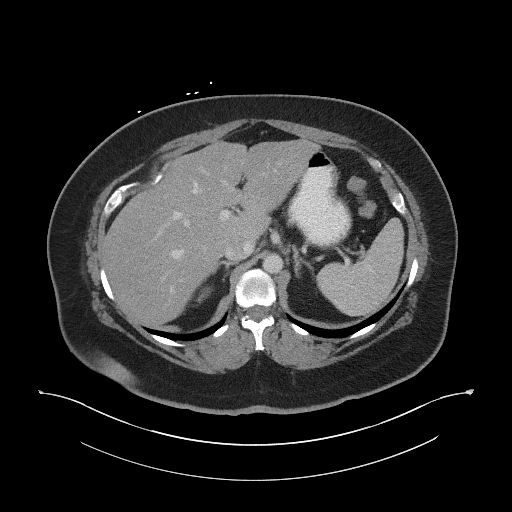
[im 89/100  soft-tissue]
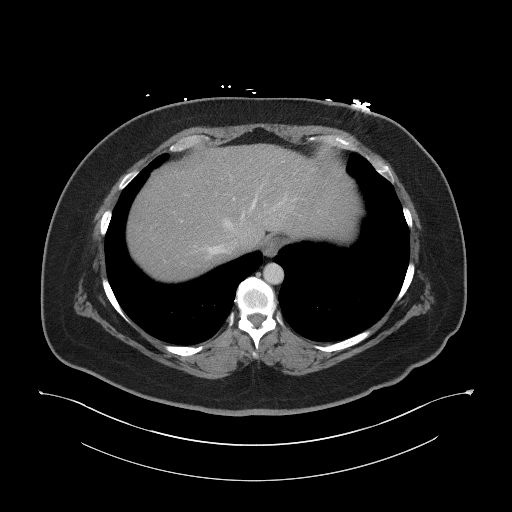
[im 94/100  soft-tissue]
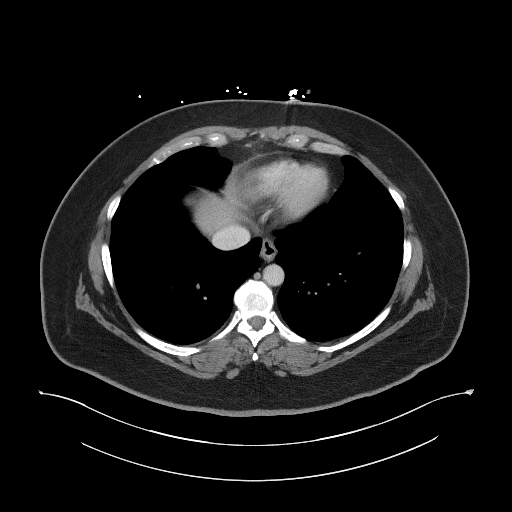

[Series 5: coronal st · coronal · 0.84mm/px · 3 of 108 slices shown]
[im 36/108  soft-tissue]
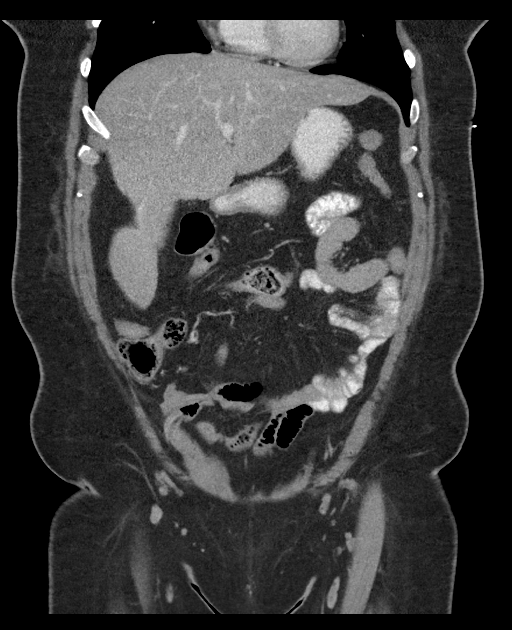
[im 48/108  soft-tissue]
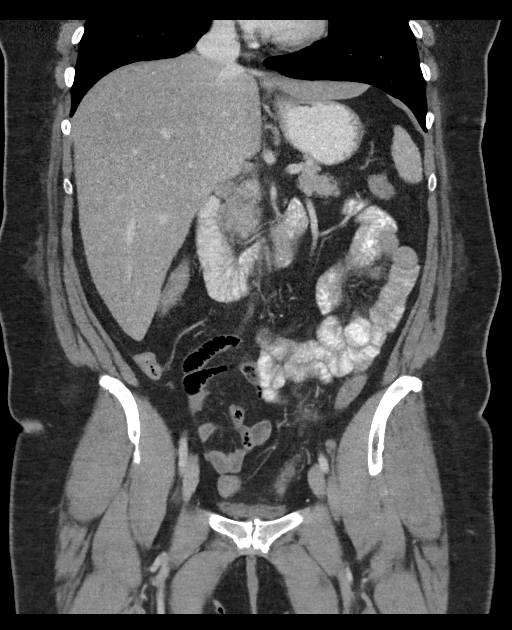
[im 60/108  soft-tissue]
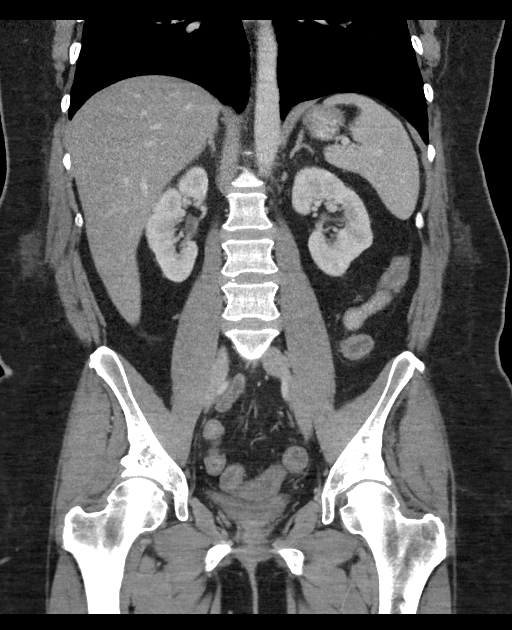

[16 of 46 positions shown; findings below may reference images not displayed]

FINDINGS: Lower chest: Negative. Small calcified granuloma in the left lower
lobe.

Hepatobiliary: Surgically absent gallbladder. Hepatic steatosis. No
bile duct enlargement.

Pancreas: Negative.

Spleen: Negative.

Adrenals/Urinary Tract: Normal adrenal glands.

Symmetric renal enhancement and contrast excretion with decompressed
and normal proximal ureters. No nephrolithiasis. Diminutive and
unremarkable urinary bladder.

Stomach/Bowel: Decompressed but thick-walled and redundant sigmoid
colon throughout the pelvis. Similar decompressed but inflamed
appearance of the rectum, descending colon. The transverse and right
colon are intermittently more normal, but with some decompressed
segments which also appear mildly inflamed. The cecum is on a lax
mesentery in the mid abdomen containing stool, with a suspected
diminutive and normal appendix on series 2, image 62.

Decompressed and negative terminal ileum. Oral contrast has not yet
reached the distal small bowel. No dilated or inflamed small bowel
loops identified. Negative stomach and duodenum. No free air or free
fluid.

Vascular/Lymphatic: Mild atherosclerosis. The major arterial
structures are patent. Portal venous system appears patent. No
lymphadenopathy.

Reproductive: Surgically absent uterus. Normal left ovary on series
2, image 68. The right ovary is diminutive or absent.

Other: No pelvic free fluid.

Musculoskeletal: Lower lumbar facet degeneration. Degenerative
changes to both SI joints. No acute osseous abnormality identified.
IMPRESSION: 1. Much of the large bowel is decompressed but appears inflamed
compatible with Widespread Infectious Or Inflammatory Colitis.
The right colon is relatively spared, with no evidence of acute
appendicitis.

2. No abnormal small bowel. No free fluid.

3. Hepatic steatosis.

## 2023-03-28 ENCOUNTER — Other Ambulatory Visit: Payer: Self-pay

## 2023-03-28 ENCOUNTER — Emergency Department (HOSPITAL_BASED_OUTPATIENT_CLINIC_OR_DEPARTMENT_OTHER): Payer: 59

## 2023-03-28 ENCOUNTER — Emergency Department (HOSPITAL_BASED_OUTPATIENT_CLINIC_OR_DEPARTMENT_OTHER)
Admission: EM | Admit: 2023-03-28 | Discharge: 2023-03-28 | Disposition: A | Discharge status: Home / Self Care | Payer: 59 | Attending: Emergency Medicine | Admitting: Emergency Medicine

## 2023-03-28 ENCOUNTER — Encounter (HOSPITAL_BASED_OUTPATIENT_CLINIC_OR_DEPARTMENT_OTHER): Payer: Self-pay

## 2023-03-28 DIAGNOSIS — E119 Type 2 diabetes mellitus without complications: Secondary | ICD-10-CM | POA: Insufficient documentation

## 2023-03-28 DIAGNOSIS — K529 Noninfective gastroenteritis and colitis, unspecified: Secondary | ICD-10-CM | POA: Diagnosis not present

## 2023-03-28 DIAGNOSIS — R197 Diarrhea, unspecified: Secondary | ICD-10-CM | POA: Diagnosis present

## 2023-03-28 HISTORY — DX: Type 2 diabetes mellitus without complications: E11.9

## 2023-03-28 HISTORY — DX: Disorder of thyroid, unspecified: E07.9

## 2023-03-28 LAB — LIPASE, BLOOD: Lipase: 26 U/L (ref 11–51)

## 2023-03-28 LAB — CBC WITH DIFFERENTIAL/PLATELET
Abs Immature Granulocytes: 0.12 10*3/uL — ABNORMAL HIGH (ref 0.00–0.07)
Basophils Absolute: 0.1 10*3/uL (ref 0.0–0.1)
Basophils Relative: 0 %
Eosinophils Absolute: 0 10*3/uL (ref 0.0–0.5)
Eosinophils Relative: 0 %
HCT: 46.8 % — ABNORMAL HIGH (ref 36.0–46.0)
Hemoglobin: 15.2 g/dL — ABNORMAL HIGH (ref 12.0–15.0)
Immature Granulocytes: 1 %
Lymphocytes Relative: 4 %
Lymphs Abs: 0.8 10*3/uL (ref 0.7–4.0)
MCH: 26.9 pg (ref 26.0–34.0)
MCHC: 32.5 g/dL (ref 30.0–36.0)
MCV: 82.8 fL (ref 80.0–100.0)
Monocytes Absolute: 1.1 10*3/uL — ABNORMAL HIGH (ref 0.1–1.0)
Monocytes Relative: 5 %
Neutro Abs: 21 10*3/uL — ABNORMAL HIGH (ref 1.7–7.7)
Neutrophils Relative %: 90 %
Platelets: 479 10*3/uL — ABNORMAL HIGH (ref 150–400)
RBC: 5.65 MIL/uL — ABNORMAL HIGH (ref 3.87–5.11)
RDW: 14 % (ref 11.5–15.5)
WBC: 23.1 10*3/uL — ABNORMAL HIGH (ref 4.0–10.5)
nRBC: 0 % (ref 0.0–0.2)

## 2023-03-28 LAB — COMPREHENSIVE METABOLIC PANEL
ALT: 29 U/L (ref 0–44)
AST: 26 U/L (ref 15–41)
Albumin: 3.9 g/dL (ref 3.5–5.0)
Alkaline Phosphatase: 103 U/L (ref 38–126)
Anion gap: 11 (ref 5–15)
BUN: 23 mg/dL — ABNORMAL HIGH (ref 6–20)
CO2: 22 mmol/L (ref 22–32)
Calcium: 8.9 mg/dL (ref 8.9–10.3)
Chloride: 102 mmol/L (ref 98–111)
Creatinine, Ser: 1.36 mg/dL — ABNORMAL HIGH (ref 0.44–1.00)
GFR, Estimated: 45 mL/min — ABNORMAL LOW (ref >60)
Glucose, Bld: 169 mg/dL — ABNORMAL HIGH (ref 70–99)
Potassium: 4 mmol/L (ref 3.5–5.1)
Sodium: 135 mmol/L (ref 135–145)
Total Bilirubin: 0.4 mg/dL (ref 0.3–1.2)
Total Protein: 7.3 g/dL (ref 6.5–8.1)

## 2023-03-28 MED ORDER — MORPHINE SULFATE (PF) 4 MG/ML IV SOLN
4.0000 mg | Freq: Once | INTRAVENOUS | Status: AC
  Administered 2023-03-28: 4 mg via INTRAVENOUS
  Filled 2023-03-28: qty 1

## 2023-03-28 MED ORDER — LACTATED RINGERS IV BOLUS
2000.0000 mL | Freq: Once | INTRAVENOUS | Status: AC
  Administered 2023-03-28: 2000 mL via INTRAVENOUS

## 2023-03-28 MED ORDER — ONDANSETRON HCL 4 MG/2ML IJ SOLN
4.0000 mg | Freq: Once | INTRAMUSCULAR | Status: AC
  Administered 2023-03-28: 4 mg via INTRAVENOUS
  Filled 2023-03-28: qty 2

## 2023-03-28 MED ORDER — ONDANSETRON 4 MG PO TBDP
4.0000 mg | ORAL_TABLET | Freq: Three times a day (TID) | ORAL | 0 refills | Status: AC | PRN
Start: 2023-03-28 — End: ?

## 2023-03-28 MED ORDER — AMOXICILLIN-POT CLAVULANATE 875-125 MG PO TABS: 1.0000 | ORAL_TABLET | Freq: Two times a day (BID) | ORAL | 0 refills | Status: AC

## 2023-03-28 MED ORDER — AMOXICILLIN-POT CLAVULANATE 875-125 MG PO TABS
1.0000 | ORAL_TABLET | Freq: Once | ORAL | Status: AC
  Administered 2023-03-28: 1 via ORAL
  Filled 2023-03-28: qty 1

## 2023-03-28 MED ORDER — IOHEXOL 300 MG/ML  SOLN
100.0000 mL | Freq: Once | INTRAMUSCULAR | Status: AC | PRN
  Administered 2023-03-28: 100 mL via INTRAVENOUS

## 2023-03-28 MED ORDER — HYDROCODONE-ACETAMINOPHEN 5-325 MG PO TABS: 1.0000 | ORAL_TABLET | Freq: Four times a day (QID) | ORAL | 0 refills | Status: AC | PRN

## 2023-03-28 NOTE — Discharge Instructions (Signed)
Your workup today showed you have colitis.  Antibiotics prescribed.  First dose given in the emergency department.  For any concerning symptoms return to the emergency room otherwise follow-up with your gastroenterologist.

## 2023-03-28 NOTE — ED Notes (Signed)
Pt to CT

## 2023-03-28 NOTE — ED Notes (Signed)
Pt ret from CT

## 2023-03-28 NOTE — ED Notes (Signed)
Pt aware of the need for a urine sample, unable to void at this time.  °

## 2023-03-28 NOTE — ED Provider Notes (Signed)
[facility] [facility] Provider Note   CSN: [id number] Arrival date & time: 03/28/23  1912     History  Chief Complaint  Patient presents with   Diarrhea   Abdominal Pain    Alicia Scott is a 57 y.o. female.  57 year old female presents today for concern of syncopal episode, abdominal pain, diarrhea, and blood in stool.  Symptoms started last night.  She states prior to her syncopal episode she felt hot, and lightheaded.  She lowered herself to the floor.  At the time she was in the bathroom.  She had episode of colitis last year.  Pain is across the lower abdomen.  Denies fever.  Has not been able to tolerate any p.o. intake today.  No hematemesis.  States she is having bright red blood per rectum.  No melanotic stools. Denies palpitations, chest pain, shortness of breath surrounding the syncopal episode.  The history is provided by the patient. No language interpreter was used.       Home Medications Prior to Admission medications   Medication Sig Start Date End Date Taking? Authorizing Provider  buPROPion (WELLBUTRIN) 75 MG tablet Take 75 mg by mouth daily.    [provider]  famotidine (PEPCID) 40 MG tablet Take 1 tablet (40 mg total) by mouth daily. 04/02/20   Lynann Bologna, MD  levothyroxine (SYNTHROID) 125 MCG tablet Take 125 mcg by mouth daily before breakfast.    [provider]  lisinopril (ZESTRIL) 10 MG tablet Take 10 mg by mouth daily.    [provider]  pantoprazole (PROTONIX) 40 MG tablet Take 1 tablet (40 mg total) by mouth daily. 04/02/20   Lynann Bologna, MD  venlafaxine XR (EFFEXOR-XR) 150 MG 24 hr capsule Take 150 mg by mouth daily with breakfast.    [provider]  zolpidem (AMBIEN CR) 12.5 MG CR tablet Take 12.5 mg by mouth at bedtime as needed for sleep.    [provider]      Allergies    Penicillins and Sulfa antibiotics    Review of Systems   Review of Systems   Constitutional:  Negative for chills and fever.  Respiratory:  Negative for shortness of breath.   Cardiovascular:  Negative for chest pain and palpitations.  Gastrointestinal:  Positive for abdominal pain, blood in stool, diarrhea, nausea and vomiting.  Neurological:  Positive for syncope. Negative for light-headedness.  All other systems reviewed and are negative.   Physical Exam Updated Vital Signs BP (!) 148/104 (BP Location: Right Arm)   Pulse (!) 127   Temp (!) 97.2 F (36.2 C)   Resp 16   Ht 5\' 9"  (1.753 m)   Wt 98.9 kg   SpO2 99%   BMI 32.19 kg/m  Physical Exam Vitals and nursing note reviewed.  Constitutional:      General: She is not in acute distress (Appears uncomfortable).    Appearance: Normal appearance. She is diaphoretic. She is not ill-appearing.  HENT:     Head: Normocephalic and atraumatic.     Nose: Nose normal.  Eyes:     General: No scleral icterus.    Extraocular Movements: Extraocular movements intact.     Conjunctiva/sclera: Conjunctivae normal.  Cardiovascular:     Rate and Rhythm: Normal rate and regular rhythm.     Pulses: Normal pulses.     Heart sounds: Normal heart sounds.  Pulmonary:     Effort: Pulmonary effort is normal. No respiratory distress.  Breath sounds: Normal breath sounds. No wheezing or rales.  Abdominal:     General: There is no distension.     Tenderness: There is abdominal tenderness. There is guarding. There is no right CVA tenderness or left CVA tenderness.  Musculoskeletal:        General: Normal range of motion.     Cervical back: Normal range of motion.  Skin:    General: Skin is warm.  Neurological:     General: No focal deficit present.     Mental Status: She is alert. Mental status is at baseline.     ED Results / Procedures / Treatments   Labs (all labs ordered are listed, but only abnormal results are displayed) Labs Reviewed  LIPASE, BLOOD  COMPREHENSIVE METABOLIC PANEL  URINALYSIS, ROUTINE W  REFLEX MICROSCOPIC  CBC WITH DIFFERENTIAL/PLATELET    EKG None  Radiology No results found.  Procedures Procedures    Medications Ordered in ED Medications  morphine (PF) 4 MG/ML injection 4 mg (has no administration in time range)  lactated ringers bolus 2,000 mL (has no administration in time range)  ondansetron (ZOFRAN) injection 4 mg (has no administration in time range)    ED Course/ Medical Decision Making/ A&P                                 Medical Decision Making Amount and/or Complexity of Data Reviewed Labs: ordered. Radiology: ordered.  Risk Prescription drug management.   Medical Decision Making / ED Course   This patient presents to the ED for concern of abdominal pain, blood in stool, syncopal episode, this involves an extensive number of treatment options, and is a complaint that carries with it a high risk of complications and morbidity.  The differential diagnosis includes vasovagal episode, arrhythmia, colitis, gastroenteritis, pancreatitis, diverticulitis, appendicitis  MDM: 57 year old female presents for concern of bloody diarrhea, abdominal pain.  Symptomatic since yesterday.  Had a syncopal episode yesterday.  Sounds vasovagal.  Will place on cardiac monitor and obtain EKG.  Will provide volume resuscitation and obtain labs, will provide pain control and obtain CT imaging.  Does have history of colitis.  Workup reveals CBC with leukocytosis of 23.1 with left shift.  No anemia.  CMP with mild renal insufficiency, glucose 169 otherwise without acute finding.  Potassium 4.0.  Lipase within normal limit.  CT abdomen pelvis with evidence of colitis.  No other acute.  EKG without acute ischemic changes.  She is appropriate for discharge.  Discharged in stable condition.  Augmentin prescribed.  Patient states that he tolerated Augmentin despite her penicillin allergy.  Patient dose of pain medication given prior to discharge.  Patient is in agreement with  plan.  Discharged in stable condition.  Lab Tests: -I ordered, reviewed, and interpreted labs.   The pertinent results include:   Labs Reviewed  LIPASE, BLOOD  COMPREHENSIVE METABOLIC PANEL  URINALYSIS, ROUTINE W REFLEX MICROSCOPIC  CBC WITH DIFFERENTIAL/PLATELET      EKG  EKG Interpretation Date/Time:    Ventricular Rate:    PR Interval:    QRS Duration:    QT Interval:    QTC Calculation:   R Axis:      Text Interpretation:           Imaging Studies ordered: I ordered imaging studies including CT abdomen pelvis with contrast I independently visualized and interpreted imaging. I agree with the radiologist interpretation   Medicines  ordered and prescription drug management: Meds ordered this encounter  Medications   morphine (PF) 4 MG/ML injection 4 mg   lactated ringers bolus 2,000 mL   ondansetron (ZOFRAN) injection 4 mg    -I have reviewed the patients home medicines and have made adjustments as needed  Critical interventions Pain control, IV fluids  Cardiac Monitoring: The patient was maintained on a cardiac monitor.  I personally viewed and interpreted the cardiac monitored which showed an underlying rhythm of: Initially sinus tachycardia improved to normal sinus rhythm  Reevaluation: After the interventions noted above, I reevaluated the patient and found that they have :improved  Co morbidities that complicate the patient evaluation  Past Medical History:  Diagnosis Date   Diabetes mellitus without complication (HCC)    Thyroid disease       Dispostion: Patient discharged in stable condition.  She will follow-up with her gastroenterologist.  Pain medicine, nausea medicine, Augmentin sent into the pharmacy.  Return precautions reviewed.  Final Clinical Impression(s) / ED Diagnoses Final diagnoses:  Colitis    Rx / DC Orders ED Discharge Orders          Ordered    amoxicillin-clavulanate (AUGMENTIN) 875-125 MG tablet  Every 12 hours         03/28/23 2305    HYDROcodone-acetaminophen (NORCO/VICODIN) 5-325 MG tablet  Every 6 hours PRN        03/28/23 2305    ondansetron (ZOFRAN-ODT) 4 MG disintegrating tablet  Every 8 hours PRN        03/28/23 2305              Marita [place], PA-C 03/28/23 2308    Virgina Norfolk, DO 03/28/23 2310

## 2023-03-28 NOTE — ED Triage Notes (Signed)
The patient stated she has been having abd pain and vomiting, diarrhea since last night. She stated there was some blood in her stool.
